# Patient Record
Sex: Male | Born: 1960 | Race: White | Hispanic: No | Marital: Married | Smoking: Never smoker
Health system: Southern US, Community
[De-identification: ages and names within clinical notes are randomized; demographics above are authoritative.]

## PROBLEM LIST (undated history)

## (undated) DIAGNOSIS — F419 Anxiety disorder, unspecified: Secondary | ICD-10-CM

## (undated) DIAGNOSIS — G8929 Other chronic pain: Secondary | ICD-10-CM

## (undated) DIAGNOSIS — R519 Headache, unspecified: Secondary | ICD-10-CM

## (undated) DIAGNOSIS — F32A Depression, unspecified: Secondary | ICD-10-CM

## (undated) DIAGNOSIS — M1990 Unspecified osteoarthritis, unspecified site: Secondary | ICD-10-CM

## (undated) DIAGNOSIS — F329 Major depressive disorder, single episode, unspecified: Secondary | ICD-10-CM

## (undated) DIAGNOSIS — K219 Gastro-esophageal reflux disease without esophagitis: Secondary | ICD-10-CM

## (undated) DIAGNOSIS — R51 Headache: Secondary | ICD-10-CM

## (undated) DIAGNOSIS — J449 Chronic obstructive pulmonary disease, unspecified: Secondary | ICD-10-CM

## (undated) HISTORY — PX: BACK SURGERY: SHX140

## (undated) HISTORY — PX: ROTATOR CUFF REPAIR: SHX139

## (undated) HISTORY — PX: NASAL RECONSTRUCTION: SHX2069

---
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
## 2003-07-06 HISTORY — PX: CARDIAC CATHETERIZATION: SHX172

## 2008-07-05 HISTORY — PX: CERVICAL SPINE SURGERY: SHX589

## 2009-05-14 ENCOUNTER — Ambulatory Visit (HOSPITAL_COMMUNITY): Admission: RE | Admit: 2009-05-14 | Discharge: 2009-05-15 | Payer: Self-pay | Admitting: Neurological Surgery

## 2009-06-16 ENCOUNTER — Encounter: Admission: RE | Admit: 2009-06-16 | Discharge: 2009-06-16 | Payer: Self-pay | Admitting: Neurological Surgery

## 2009-12-16 ENCOUNTER — Encounter: Admission: RE | Admit: 2009-12-16 | Discharge: 2009-12-16 | Payer: Self-pay | Admitting: Neurological Surgery

## 2010-05-25 ENCOUNTER — Encounter: Admission: RE | Admit: 2010-05-25 | Discharge: 2010-05-25 | Payer: Self-pay | Admitting: Neurological Surgery

## 2010-09-21 IMAGING — RF DG CERVICAL SPINE 1V
1 series · 1 of 1 positions shown · non-contrast
Comparison: None

CLINICAL DATA: C5-C7 ACDF

CERVICAL SPINE - 1 VIEW

[Series 1: run · 1 of 1 slices shown]
[im 1/1]
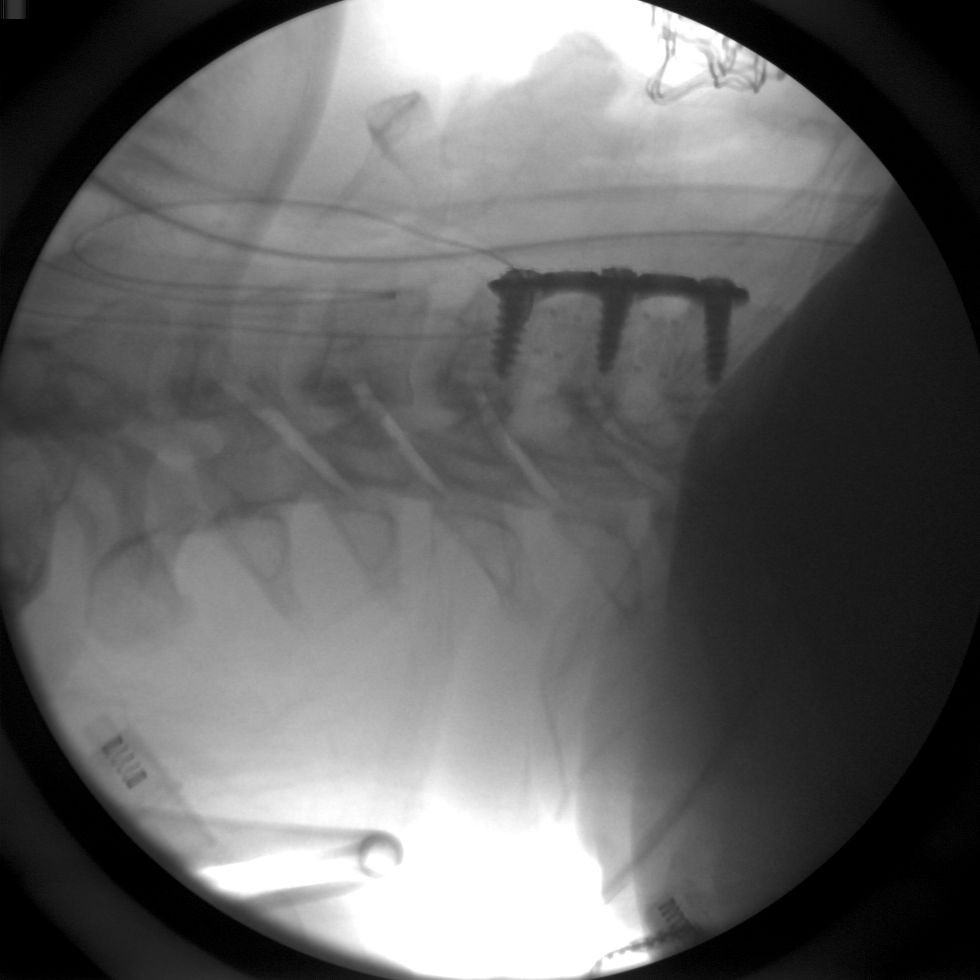

[1 of 1 positions shown; findings below may reference images not displayed]

FINDINGS: The single view shows anterior cervical discectomy and
fusion at C5-6 and C6-7.  Interbody fusion material is present.
There is anterior plate with screw fixation.  No apparent
complication.
IMPRESSION: ACDF C5-C7

## 2010-10-07 LAB — BASIC METABOLIC PANEL
Chloride: 104 mEq/L (ref 96–112)
GFR calc Af Amer: >60 mL/min (ref >60)
Potassium: 4.8 mEq/L (ref 3.5–5.1)

## 2010-10-07 LAB — CBC
HCT: 38 % — ABNORMAL LOW (ref 39.0–52.0)
Hemoglobin: 12.9 g/dL — ABNORMAL LOW (ref 13.0–17.0)
MCV: 95 fL (ref 78.0–100.0)
RBC: 4 MIL/uL — ABNORMAL LOW (ref 4.22–5.81)
WBC: 5.9 10*3/uL (ref 4.0–10.5)

## 2010-10-07 LAB — DIFFERENTIAL
Eosinophils Absolute: 0.1 10*3/uL (ref 0.0–0.7)
Eosinophils Relative: 2 % (ref 0–5)
Lymphs Abs: 1.3 10*3/uL (ref 0.7–4.0)
Monocytes Relative: 6 % (ref 3–12)

## 2010-10-07 LAB — PROTIME-INR: INR: 0.99 (ref 0.00–1.49)

## 2011-10-02 IMAGING — CT CT CERVICAL SPINE W/O CM
3 of 10 series · 11 of 34 positions shown, 12 images · non-contrast
Comparison: Lateral radiograph dated 12/16/2009

CLINICAL DATA: Chronic neck pain.  Prior anterior cervical fusion
at C5-6 and C6-7.

CT CERVICAL SPINE WITHOUT CONTRAST
TECHNIQUE: Multidetector CT imaging of the cervical spine was
performed. Multiplanar CT image reconstructions were also
generated.

[Series 6: t spine bone · axial · 0.32mm/px · z∈[-329,-2]mm · 3 of 132 slices shown, 4 images]
[im 1/132  soft-tissue]
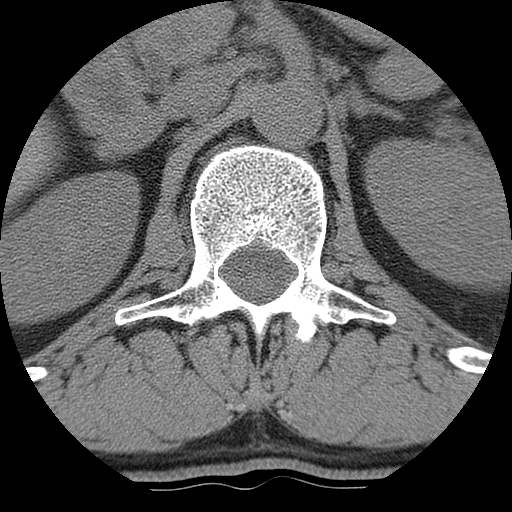
[im 1/132  bone]
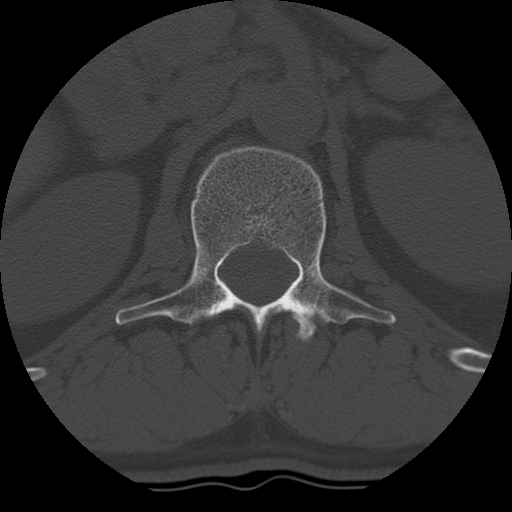
[im 66/132  bone]
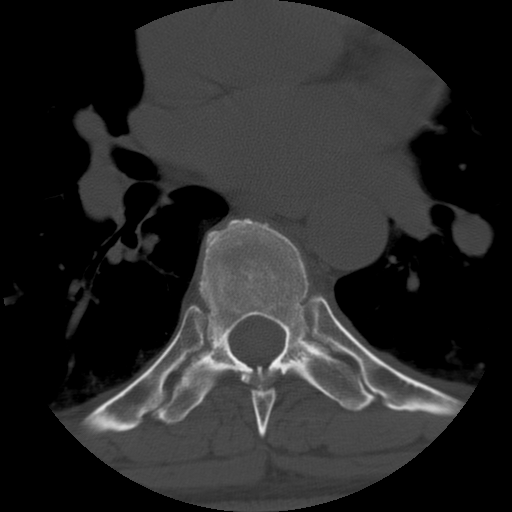
[im 132/132  bone]
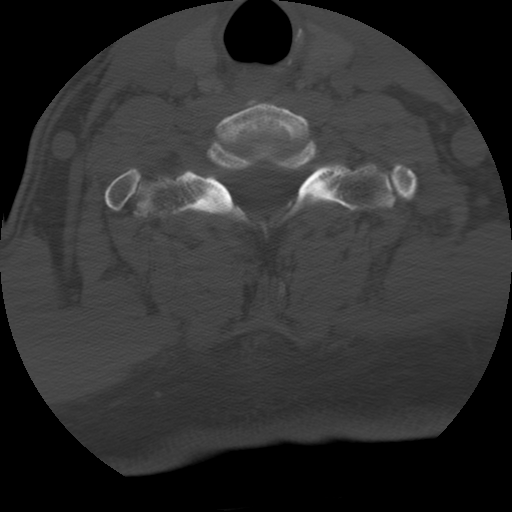

[Series 202: ang axial c-sp · axial · 0.20mm/px · z∈[-4,+90]mm · 3 of 272 slices shown]
[im 68/272  bone]
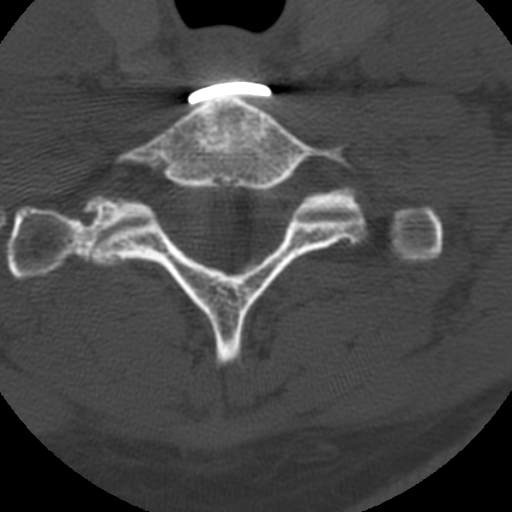
[im 136/272  bone]
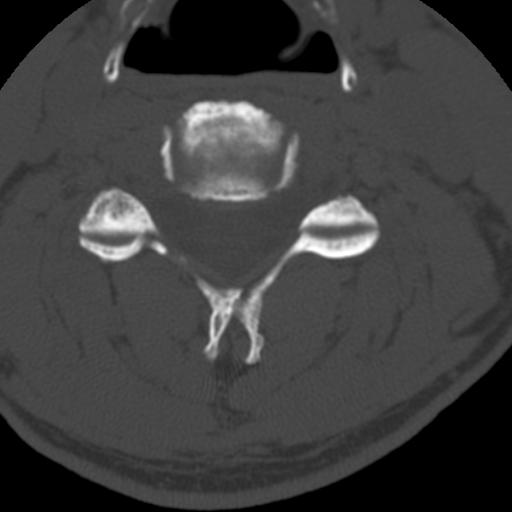
[im 204/272  bone]
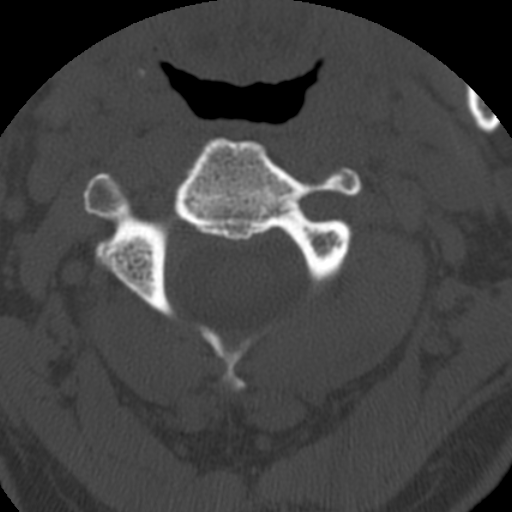

[Series 500: cor - upper t-sp · coronal · 0.66mm/px · 5 of 44 slices shown]
[im 2/44  bone]
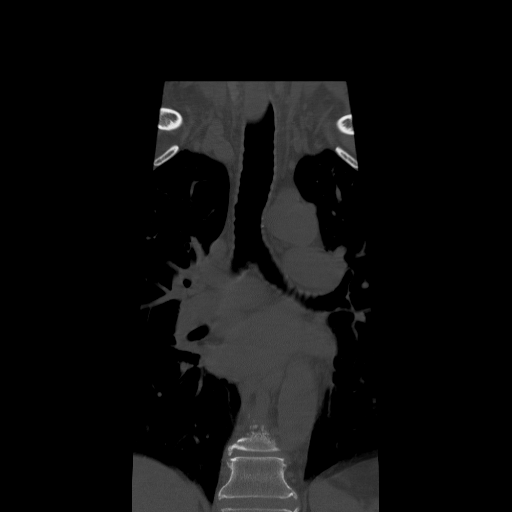
[im 9/44  bone]
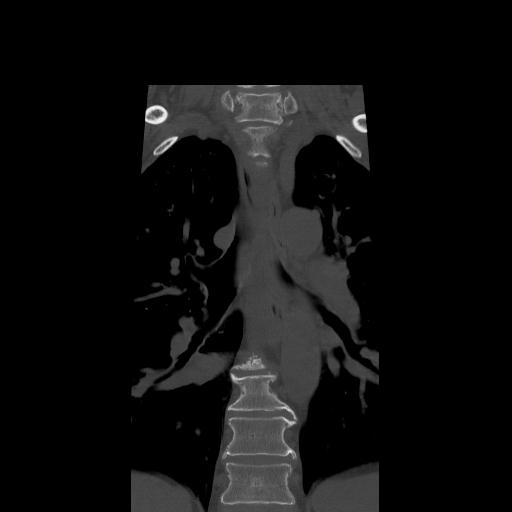
[im 16/44  bone]
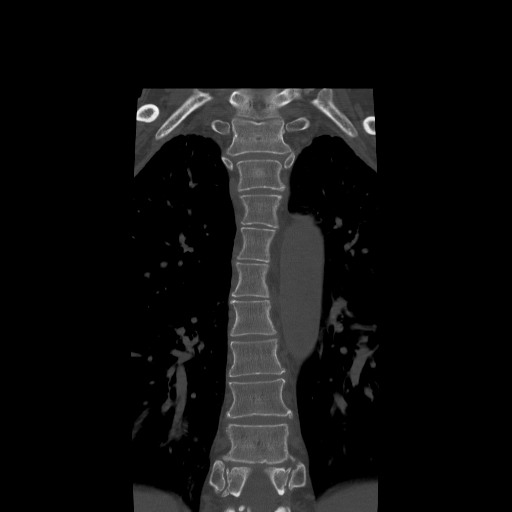
[im 23/44  bone]
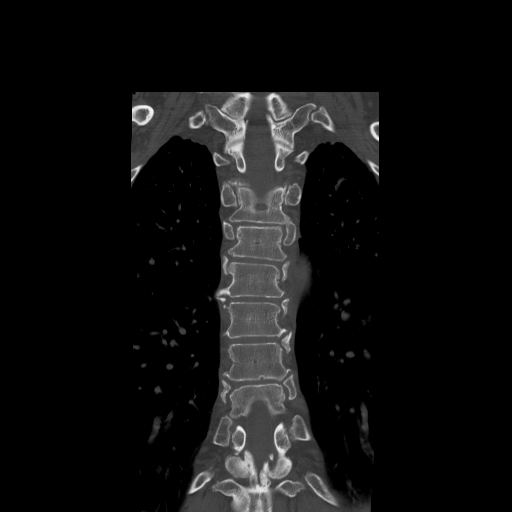
[im 30/44  bone]
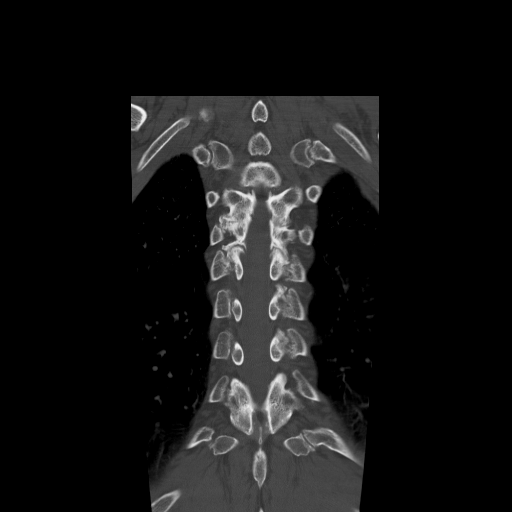

[11 of 34 positions shown; findings below may reference images not displayed]

FINDINGS: The scan extends from the mid clivus through the T1-2
level. The individual disc spaces were examined as follows:

C1-2:  There is degenerative spurring on the tip of the odontoid
process and there are mild arthritic changes between the anterior
arch of C1 and the odontoid.

C2-3: Normal.

C3-4: Normal.

C4-5: Normal.

C5-6: Solid anterior fusion with no residual impingement.

C6-7: Solid anterior fusion with no residual impingement.

C7-T1: Normal.

T1-2:  Normal.

There is no significant abnormality of the paraspinal soft tissues.
IMPRESSION: 1.  Solid anterior fusions at C5-6 and C6-7 with no residual
impingement.
2.  Mild degenerative changes between the anterior arch of C1 and
the odontoid process of C2.
3.  No other significant abnormalities.

## 2016-11-16 ENCOUNTER — Other Ambulatory Visit: Payer: Self-pay | Admitting: Neurological Surgery

## 2016-11-22 ENCOUNTER — Inpatient Hospital Stay (HOSPITAL_COMMUNITY)
Admission: RE | Admit: 2016-11-22 | Discharge: 2016-11-22 | Disposition: A | Discharge status: Home / Self Care | Payer: Self-pay | Source: Ambulatory Visit

## 2016-12-02 NOTE — Progress Notes (Signed)
Several unsuccessful attempts were made to contact pt. Pre-op instructions were left on pt home phone as well as given to pt via pt cell phone. Pt made aware to stop taking  Aspirin, vitamins, fish oil and herbal medications. Do not take any NSAIDs ie: Ibuprofen, Advil, Naproxen, BC and Goody Powder or any medication containing Aspirin. Pt verbalized understanding of all pre-op instructions. Please complete pt assessments on DOS.

## 2016-12-03 ENCOUNTER — Ambulatory Visit (HOSPITAL_COMMUNITY): Payer: Medicare Other | Admitting: Anesthesiology

## 2016-12-03 ENCOUNTER — Encounter (HOSPITAL_COMMUNITY): Payer: Self-pay | Admitting: General Practice

## 2016-12-03 ENCOUNTER — Inpatient Hospital Stay (HOSPITAL_COMMUNITY): Payer: Medicare Other

## 2016-12-03 ENCOUNTER — Encounter (HOSPITAL_COMMUNITY)
Admission: RE | Disposition: A | Discharge status: Home / Self Care | Payer: Self-pay | Source: Ambulatory Visit | Attending: Neurological Surgery

## 2016-12-03 ENCOUNTER — Inpatient Hospital Stay (HOSPITAL_COMMUNITY)
Admission: RE | Admit: 2016-12-03 | Discharge: 2016-12-03 | DRG: 517 | Disposition: A | Discharge status: Home / Self Care | Payer: Medicare Other | Source: Ambulatory Visit | Attending: Neurological Surgery | Admitting: Neurological Surgery

## 2016-12-03 DIAGNOSIS — Z9889 Other specified postprocedural states: Secondary | ICD-10-CM

## 2016-12-03 DIAGNOSIS — Z79891 Long term (current) use of opiate analgesic: Secondary | ICD-10-CM

## 2016-12-03 DIAGNOSIS — M1990 Unspecified osteoarthritis, unspecified site: Secondary | ICD-10-CM | POA: Diagnosis not present

## 2016-12-03 DIAGNOSIS — F329 Major depressive disorder, single episode, unspecified: Secondary | ICD-10-CM | POA: Diagnosis present

## 2016-12-03 DIAGNOSIS — M4802 Spinal stenosis, cervical region: Secondary | ICD-10-CM | POA: Diagnosis not present

## 2016-12-03 DIAGNOSIS — Z419 Encounter for procedure for purposes other than remedying health state, unspecified: Secondary | ICD-10-CM

## 2016-12-03 DIAGNOSIS — J449 Chronic obstructive pulmonary disease, unspecified: Secondary | ICD-10-CM | POA: Diagnosis present

## 2016-12-03 DIAGNOSIS — G8929 Other chronic pain: Secondary | ICD-10-CM | POA: Diagnosis present

## 2016-12-03 DIAGNOSIS — Z79899 Other long term (current) drug therapy: Secondary | ICD-10-CM | POA: Diagnosis not present

## 2016-12-03 DIAGNOSIS — Z7982 Long term (current) use of aspirin: Secondary | ICD-10-CM | POA: Diagnosis not present

## 2016-12-03 DIAGNOSIS — K219 Gastro-esophageal reflux disease without esophagitis: Secondary | ICD-10-CM | POA: Diagnosis not present

## 2016-12-03 DIAGNOSIS — F419 Anxiety disorder, unspecified: Secondary | ICD-10-CM | POA: Diagnosis not present

## 2016-12-03 DIAGNOSIS — M4803 Spinal stenosis, cervicothoracic region: Principal | ICD-10-CM | POA: Diagnosis present

## 2016-12-03 HISTORY — DX: Anxiety disorder, unspecified: F41.9

## 2016-12-03 HISTORY — DX: Major depressive disorder, single episode, unspecified: F32.9

## 2016-12-03 HISTORY — DX: Other chronic pain: G89.29

## 2016-12-03 HISTORY — DX: Depression, unspecified: F32.A

## 2016-12-03 HISTORY — DX: Gastro-esophageal reflux disease without esophagitis: K21.9

## 2016-12-03 HISTORY — DX: Chronic obstructive pulmonary disease, unspecified: J44.9

## 2016-12-03 HISTORY — DX: Unspecified osteoarthritis, unspecified site: M19.90

## 2016-12-03 HISTORY — DX: Headache: R51

## 2016-12-03 HISTORY — DX: Headache, unspecified: R51.9

## 2016-12-03 HISTORY — PX: POSTERIOR CERVICAL LAMINECTOMY: SHX2248

## 2016-12-03 SURGERY — POSTERIOR CERVICAL LAMINECTOMY
Anesthesia: General | Laterality: Right

## 2016-12-03 MED ORDER — THROMBIN 5000 UNITS EX SOLR
CUTANEOUS | Status: AC
  Filled 2016-12-03: qty 15000

## 2016-12-03 MED ORDER — CYCLOBENZAPRINE HCL 10 MG PO TABS
10.0000 mg | ORAL_TABLET | Freq: Three times a day (TID) | ORAL | Status: DC | PRN
  Administered 2016-12-03: 10 mg via ORAL
  Filled 2016-12-03: qty 1

## 2016-12-03 MED ORDER — PHENOL 1.4 % MT LIQD: 1.0000 | OROMUCOSAL | Status: DC | PRN

## 2016-12-03 MED ORDER — AMITRIPTYLINE HCL 50 MG PO TABS
50.0000 mg | ORAL_TABLET | Freq: Every day | ORAL | Status: DC
  Filled 2016-12-03: qty 1

## 2016-12-03 MED ORDER — MIDAZOLAM HCL 5 MG/5ML IJ SOLN
INTRAMUSCULAR | Status: DC | PRN
  Administered 2016-12-03: 2 mg via INTRAVENOUS

## 2016-12-03 MED ORDER — PROPOFOL 10 MG/ML IV BOLUS
INTRAVENOUS | Status: AC
  Filled 2016-12-03: qty 20

## 2016-12-03 MED ORDER — ACETAMINOPHEN 325 MG PO TABS: 650.0000 mg | ORAL_TABLET | ORAL | Status: DC | PRN

## 2016-12-03 MED ORDER — ROCURONIUM BROMIDE 10 MG/ML (PF) SYRINGE
PREFILLED_SYRINGE | INTRAVENOUS | Status: AC
  Filled 2016-12-03: qty 5

## 2016-12-03 MED ORDER — MIDAZOLAM HCL 2 MG/2ML IJ SOLN
INTRAMUSCULAR | Status: AC
  Filled 2016-12-03: qty 2

## 2016-12-03 MED ORDER — VECURONIUM BROMIDE 10 MG IV SOLR
INTRAVENOUS | Status: AC
  Filled 2016-12-03: qty 10

## 2016-12-03 MED ORDER — HYDROMORPHONE HCL 1 MG/ML IJ SOLN
INTRAMUSCULAR | Status: DC | PRN
  Administered 2016-12-03: 1 mg via INTRAVENOUS

## 2016-12-03 MED ORDER — ROCURONIUM BROMIDE 100 MG/10ML IV SOLN
INTRAVENOUS | Status: DC | PRN
  Administered 2016-12-03: 40 mg via INTRAVENOUS
  Administered 2016-12-03: 20 mg via INTRAVENOUS
  Administered 2016-12-03: 40 mg via INTRAVENOUS

## 2016-12-03 MED ORDER — BUPIVACAINE HCL (PF) 0.25 % IJ SOLN
INTRAMUSCULAR | Status: DC | PRN
  Administered 2016-12-03: 10 mL
  Administered 2016-12-03: 7 mL

## 2016-12-03 MED ORDER — PHENYLEPHRINE HCL 10 MG/ML IJ SOLN
INTRAVENOUS | Status: DC | PRN
  Administered 2016-12-03: 45 ug/min via INTRAVENOUS

## 2016-12-03 MED ORDER — HYDROMORPHONE HCL 1 MG/ML IJ SOLN: 0.2500 mg | INTRAMUSCULAR | Status: DC | PRN

## 2016-12-03 MED ORDER — ONDANSETRON HCL 4 MG/2ML IJ SOLN
INTRAMUSCULAR | Status: AC
  Filled 2016-12-03: qty 2

## 2016-12-03 MED ORDER — BACITRACIN ZINC 500 UNIT/GM EX OINT
TOPICAL_OINTMENT | CUTANEOUS | Status: AC
  Filled 2016-12-03: qty 28.35

## 2016-12-03 MED ORDER — SODIUM CHLORIDE 0.9% FLUSH: 3.0000 mL | Freq: Two times a day (BID) | INTRAVENOUS | Status: DC

## 2016-12-03 MED ORDER — ONDANSETRON HCL 4 MG/2ML IJ SOLN
INTRAMUSCULAR | Status: DC | PRN
  Administered 2016-12-03: 4 mg via INTRAVENOUS

## 2016-12-03 MED ORDER — MUPIROCIN 2 % EX OINT
TOPICAL_OINTMENT | CUTANEOUS | Status: AC
  Filled 2016-12-03: qty 22

## 2016-12-03 MED ORDER — VECURONIUM BROMIDE 10 MG IV SOLR
INTRAVENOUS | Status: DC | PRN
  Administered 2016-12-03: 5 mg via INTRAVENOUS

## 2016-12-03 MED ORDER — POTASSIUM CHLORIDE IN NACL 20-0.9 MEQ/L-% IV SOLN
INTRAVENOUS | Status: DC
  Filled 2016-12-03: qty 1000

## 2016-12-03 MED ORDER — METHADONE HCL 10 MG PO TABS: 10.0000 mg | ORAL_TABLET | Freq: Three times a day (TID) | ORAL | Status: DC

## 2016-12-03 MED ORDER — HYDROCODONE-ACETAMINOPHEN 5-325 MG PO TABS
1.0000 | ORAL_TABLET | ORAL | Status: DC | PRN
  Administered 2016-12-03: 2 via ORAL
  Filled 2016-12-03: qty 2

## 2016-12-03 MED ORDER — CYCLOBENZAPRINE HCL 10 MG PO TABS: 10.0000 mg | ORAL_TABLET | Freq: Three times a day (TID) | ORAL | 0 refills | Status: AC | PRN

## 2016-12-03 MED ORDER — SUCCINYLCHOLINE CHLORIDE 200 MG/10ML IV SOSY
PREFILLED_SYRINGE | INTRAVENOUS | Status: AC
  Filled 2016-12-03: qty 10

## 2016-12-03 MED ORDER — LIDOCAINE 2% (20 MG/ML) 5 ML SYRINGE
INTRAMUSCULAR | Status: AC
  Filled 2016-12-03: qty 5

## 2016-12-03 MED ORDER — PREDNISONE 5 MG PO TABS
10.0000 mg | ORAL_TABLET | Freq: Every day | ORAL | Status: DC
  Filled 2016-12-03: qty 2

## 2016-12-03 MED ORDER — THROMBIN 5000 UNITS EX SOLR
CUTANEOUS | Status: DC | PRN
  Administered 2016-12-03 (×2): 5000 [IU] via TOPICAL

## 2016-12-03 MED ORDER — ARTIFICIAL TEARS OPHTHALMIC OINT
TOPICAL_OINTMENT | OPHTHALMIC | Status: AC
  Filled 2016-12-03: qty 3.5

## 2016-12-03 MED ORDER — LIDOCAINE HCL (CARDIAC) 20 MG/ML IV SOLN
INTRAVENOUS | Status: DC | PRN
  Administered 2016-12-03: 60 mg via INTRAVENOUS

## 2016-12-03 MED ORDER — FLUTICASONE PROPIONATE 50 MCG/ACT NA SUSP
1.0000 | Freq: Every day | NASAL | Status: DC | PRN
  Filled 2016-12-03: qty 16

## 2016-12-03 MED ORDER — CEFAZOLIN SODIUM-DEXTROSE 1-4 GM/50ML-% IV SOLN: 1.0000 g | Freq: Three times a day (TID) | INTRAVENOUS | Status: DC

## 2016-12-03 MED ORDER — HYDROCODONE-ACETAMINOPHEN 5-325 MG PO TABS: 1.0000 | ORAL_TABLET | Freq: Three times a day (TID) | ORAL | 0 refills | Status: AC | PRN

## 2016-12-03 MED ORDER — SUGAMMADEX SODIUM 200 MG/2ML IV SOLN
INTRAVENOUS | Status: AC
  Filled 2016-12-03: qty 2

## 2016-12-03 MED ORDER — HYDROCODONE-ACETAMINOPHEN 5-325 MG PO TABS: 1.0000 | ORAL_TABLET | Freq: Four times a day (QID) | ORAL | 0 refills | Status: AC | PRN

## 2016-12-03 MED ORDER — FENTANYL CITRATE (PF) 100 MCG/2ML IJ SOLN
INTRAMUSCULAR | Status: DC | PRN
  Administered 2016-12-03: 100 ug via INTRAVENOUS
  Administered 2016-12-03 (×2): 50 ug via INTRAVENOUS
  Administered 2016-12-03: 100 ug via INTRAVENOUS
  Administered 2016-12-03 (×2): 50 ug via INTRAVENOUS
  Administered 2016-12-03: 100 ug via INTRAVENOUS

## 2016-12-03 MED ORDER — FENTANYL CITRATE (PF) 250 MCG/5ML IJ SOLN
INTRAMUSCULAR | Status: AC
  Filled 2016-12-03: qty 5

## 2016-12-03 MED ORDER — MENTHOL 3 MG MT LOZG: 1.0000 | LOZENGE | OROMUCOSAL | Status: DC | PRN

## 2016-12-03 MED ORDER — HYDROMORPHONE HCL 1 MG/ML IJ SOLN
INTRAMUSCULAR | Status: AC
  Filled 2016-12-03: qty 0.5

## 2016-12-03 MED ORDER — SENNA 8.6 MG PO TABS: 1.0000 | ORAL_TABLET | Freq: Two times a day (BID) | ORAL | Status: DC

## 2016-12-03 MED ORDER — ONDANSETRON HCL 4 MG/2ML IJ SOLN: 4.0000 mg | Freq: Four times a day (QID) | INTRAMUSCULAR | Status: DC | PRN

## 2016-12-03 MED ORDER — LACTATED RINGERS IV SOLN
INTRAVENOUS | Status: DC | PRN
  Administered 2016-12-03 (×2): via INTRAVENOUS

## 2016-12-03 MED ORDER — SUGAMMADEX SODIUM 200 MG/2ML IV SOLN
INTRAVENOUS | Status: DC | PRN
  Administered 2016-12-03: 200 mg via INTRAVENOUS

## 2016-12-03 MED ORDER — CHLORHEXIDINE GLUCONATE CLOTH 2 % EX PADS: 6.0000 | MEDICATED_PAD | Freq: Once | CUTANEOUS | Status: DC

## 2016-12-03 MED ORDER — MELOXICAM 7.5 MG PO TABS: 15.0000 mg | ORAL_TABLET | Freq: Every day | ORAL | Status: DC

## 2016-12-03 MED ORDER — 0.9 % SODIUM CHLORIDE (POUR BTL) OPTIME
TOPICAL | Status: DC | PRN
  Administered 2016-12-03: 1000 mL

## 2016-12-03 MED ORDER — HEMOSTATIC AGENTS (NO CHARGE) OPTIME
TOPICAL | Status: DC | PRN
  Administered 2016-12-03: 1 via TOPICAL

## 2016-12-03 MED ORDER — TAMSULOSIN HCL 0.4 MG PO CAPS
0.4000 mg | ORAL_CAPSULE | Freq: Every day | ORAL | Status: DC
  Administered 2016-12-03: 0.4 mg via ORAL
  Filled 2016-12-03: qty 1

## 2016-12-03 MED ORDER — MUPIROCIN 2 % EX OINT
1.0000 "application " | TOPICAL_OINTMENT | Freq: Once | CUTANEOUS | Status: AC
  Administered 2016-12-03: 1 via TOPICAL

## 2016-12-03 MED ORDER — SODIUM CHLORIDE 0.9% FLUSH
3.0000 mL | INTRAVENOUS | Status: DC | PRN
Start: 2016-12-03 — End: 2016-12-04

## 2016-12-03 MED ORDER — PROMETHAZINE HCL 25 MG/ML IJ SOLN: 6.2500 mg | INTRAMUSCULAR | Status: DC | PRN

## 2016-12-03 MED ORDER — ACETAMINOPHEN 650 MG RE SUPP: 650.0000 mg | RECTAL | Status: DC | PRN

## 2016-12-03 MED ORDER — BUPIVACAINE HCL (PF) 0.25 % IJ SOLN
INTRAMUSCULAR | Status: AC
  Filled 2016-12-03: qty 30

## 2016-12-03 MED ORDER — CEFAZOLIN SODIUM-DEXTROSE 2-4 GM/100ML-% IV SOLN
2.0000 g | INTRAVENOUS | Status: AC
  Administered 2016-12-03: 2 g via INTRAVENOUS

## 2016-12-03 MED ORDER — DEXTROSE 5 % IV SOLN
INTRAVENOUS | Status: DC | PRN
  Administered 2016-12-03: 12:00:00 via INTRAVENOUS

## 2016-12-03 MED ORDER — CEFAZOLIN SODIUM-DEXTROSE 2-4 GM/100ML-% IV SOLN
INTRAVENOUS | Status: AC
  Filled 2016-12-03: qty 100

## 2016-12-03 MED ORDER — ONDANSETRON HCL 4 MG PO TABS: 4.0000 mg | ORAL_TABLET | Freq: Four times a day (QID) | ORAL | Status: DC | PRN

## 2016-12-03 MED ORDER — DEXAMETHASONE SODIUM PHOSPHATE 10 MG/ML IJ SOLN
10.0000 mg | INTRAMUSCULAR | Status: AC
  Administered 2016-12-03: 10 mg via INTRAVENOUS
  Filled 2016-12-03: qty 1

## 2016-12-03 MED ORDER — PROPOFOL 10 MG/ML IV BOLUS
INTRAVENOUS | Status: DC | PRN
  Administered 2016-12-03: 200 mg via INTRAVENOUS
  Administered 2016-12-03 (×2): 50 mg via INTRAVENOUS

## 2016-12-03 MED ORDER — LACTATED RINGERS IV SOLN
INTRAVENOUS | Status: DC
  Administered 2016-12-03: 10:00:00 via INTRAVENOUS

## 2016-12-03 MED ORDER — SUCCINYLCHOLINE CHLORIDE 20 MG/ML IJ SOLN
INTRAMUSCULAR | Status: DC | PRN
  Administered 2016-12-03: 100 mg via INTRAVENOUS

## 2016-12-03 MED ORDER — ARTIFICIAL TEARS OPHTHALMIC OINT
TOPICAL_OINTMENT | OPHTHALMIC | Status: DC | PRN
  Administered 2016-12-03 (×2): 1 via OPHTHALMIC

## 2016-12-03 SURGICAL SUPPLY — 50 items
BAG DECANTER FOR FLEXI CONT (MISCELLANEOUS) ×3 IMPLANT
BENZOIN TINCTURE PRP APPL 2/3 (GAUZE/BANDAGES/DRESSINGS) ×3 IMPLANT
BLADE CLIPPER SURG (BLADE) IMPLANT
BUR MATCHSTICK NEURO 3.0 LAGG (BURR) ×3 IMPLANT
CANISTER SUCT 3000ML PPV (MISCELLANEOUS) ×3 IMPLANT
CARTRIDGE OIL MAESTRO DRILL (MISCELLANEOUS) ×1 IMPLANT
CLOSURE WOUND 1/2 X4 (GAUZE/BANDAGES/DRESSINGS) ×1
DERMABOND ADVANCED (GAUZE/BANDAGES/DRESSINGS) ×2
DERMABOND ADVANCED .7 DNX12 (GAUZE/BANDAGES/DRESSINGS) ×1 IMPLANT
DIFFUSER DRILL AIR PNEUMATIC (MISCELLANEOUS) ×3 IMPLANT
DRAPE LAPAROTOMY 100X72 PEDS (DRAPES) ×3 IMPLANT
DRAPE MICROSCOPE LEICA (MISCELLANEOUS) ×3 IMPLANT
DRAPE POUCH INSTRU U-SHP 10X18 (DRAPES) ×3 IMPLANT
DRSG OPSITE POSTOP 3X4 (GAUZE/BANDAGES/DRESSINGS) ×3 IMPLANT
DURAPREP 26ML APPLICATOR (WOUND CARE) ×3 IMPLANT
ELECT REM PT RETURN 9FT ADLT (ELECTROSURGICAL) ×3
ELECTRODE REM PT RTRN 9FT ADLT (ELECTROSURGICAL) ×1 IMPLANT
GAUZE SPONGE 4X4 12PLY STRL (GAUZE/BANDAGES/DRESSINGS) ×3 IMPLANT
GAUZE SPONGE 4X4 16PLY XRAY LF (GAUZE/BANDAGES/DRESSINGS) IMPLANT
GLOVE BIO SURGEON STRL SZ7 (GLOVE) ×3 IMPLANT
GLOVE BIO SURGEON STRL SZ8 (GLOVE) ×3 IMPLANT
GLOVE BIOGEL PI IND STRL 7.0 (GLOVE) IMPLANT
GLOVE BIOGEL PI INDICATOR 7.0 (GLOVE)
GOWN STRL REUS W/ TWL LRG LVL3 (GOWN DISPOSABLE) ×3 IMPLANT
GOWN STRL REUS W/ TWL XL LVL3 (GOWN DISPOSABLE) ×1 IMPLANT
GOWN STRL REUS W/TWL 2XL LVL3 (GOWN DISPOSABLE) IMPLANT
GOWN STRL REUS W/TWL LRG LVL3 (GOWN DISPOSABLE) ×6
GOWN STRL REUS W/TWL XL LVL3 (GOWN DISPOSABLE) ×2
HEMOSTAT POWDER KIT SURGIFOAM (HEMOSTASIS) ×3 IMPLANT
KIT BASIN OR (CUSTOM PROCEDURE TRAY) ×3 IMPLANT
KIT ROOM TURNOVER OR (KITS) ×3 IMPLANT
NEEDLE HYPO 22GX1.5 SAFETY (NEEDLE) ×3 IMPLANT
NEEDLE SPNL 20GX3.5 QUINCKE YW (NEEDLE) ×3 IMPLANT
NS IRRIG 1000ML POUR BTL (IV SOLUTION) ×3 IMPLANT
OIL CARTRIDGE MAESTRO DRILL (MISCELLANEOUS) ×3
PACK LAMINECTOMY NEURO (CUSTOM PROCEDURE TRAY) ×3 IMPLANT
PAD ARMBOARD 7.5X6 YLW CONV (MISCELLANEOUS) ×3 IMPLANT
PIN MAYFIELD SKULL DISP (PIN) ×3 IMPLANT
RUBBERBAND STERILE (MISCELLANEOUS) ×6 IMPLANT
SPONGE LAP 4X18 X RAY DECT (DISPOSABLE) IMPLANT
SPONGE SURGIFOAM ABS GEL SZ50 (HEMOSTASIS) ×3 IMPLANT
STRIP CLOSURE SKIN 1/2X4 (GAUZE/BANDAGES/DRESSINGS) ×2 IMPLANT
SUT VIC AB 0 CT1 18XCR BRD8 (SUTURE) ×1 IMPLANT
SUT VIC AB 0 CT1 8-18 (SUTURE) ×2
SUT VIC AB 2-0 CP2 18 (SUTURE) ×3 IMPLANT
SUT VIC AB 3-0 SH 8-18 (SUTURE) ×3 IMPLANT
SWABSTICK BENZOIN STERILE (MISCELLANEOUS) ×3 IMPLANT
TOWEL GREEN STERILE (TOWEL DISPOSABLE) ×3 IMPLANT
TOWEL GREEN STERILE FF (TOWEL DISPOSABLE) ×3 IMPLANT
WATER STERILE IRR 1000ML POUR (IV SOLUTION) ×3 IMPLANT

## 2016-12-03 NOTE — H&P (Signed)
Subjective:   Patient is a 56 y.o. male admitted for Right arm pain with hand weakness. The patient first presented to me with complaints of neck pain, shooting pains in the arm(s) and loss of strength of the arm(s). Onset of symptoms was several weeks ago. The pain is described as aching and occurs all day. The pain is rated severe, and is located in the neck and radiates to the right arm. The symptoms have been progressive. Symptoms are exacerbated by extending head backwards, and are relieved by none.  Previous work up includes MRI of cervical spine, results: spinal stenosis.  Past Medical History:  Diagnosis Date  . Anxiety   . Arthritis   . Chronic pain   . COPD (chronic obstructive pulmonary disease) (HCC)    recent diagnosis 2018  . Depression   . GERD (gastroesophageal reflux disease)   . Headache     Past Surgical History:  Procedure Laterality Date  . BACK SURGERY    . CARDIAC CATHETERIZATION  2005  . CERVICAL SPINE SURGERY  2010   C5- C6  . FACIAL FRACTURE SURGERY  1978  . NASAL RECONSTRUCTION Left   . ROTATOR CUFF REPAIR Right     Allergies  Allergen Reactions  . No Known Allergies     Social History  Substance Use Topics  . Smoking status: Never Smoker  . Smokeless tobacco: Never Used  . Alcohol use No    History reviewed. No pertinent family history. Prior to Admission medications   Medication Sig Start Date End Date Taking? Authorizing Provider  amitriptyline (ELAVIL) 50 MG tablet Take 50 mg by mouth at bedtime. 11/15/16  Yes [provider]  Aspirin-Caffeine 845-65 MG PACK Take 2 packets by mouth 3 (three) times daily as needed (pain).   Yes [provider]  betamethasone dipropionate 0.05 % lotion Apply 1 application topically at bedtime as needed (skin irritation).  04/09/16 04/09/17 Yes [provider]  Carboxymethylcellul-Glycerin (LUBRICATING EYE DROPS OP) Apply 2 drops to eye daily as needed.   Yes [provider]   Cholecalciferol (VITAMIN D3) 2000 units capsule Take 4,000 Units by mouth daily.   Yes [provider]  cyanocobalamin 500 MCG tablet Take 1,000 mcg by mouth daily.   Yes [provider]  cyclobenzaprine (FLEXERIL) 10 MG tablet Take 10 mg by mouth every 12 (twelve) hours as needed. 11/16/16  Yes [provider]  fluticasone (FLONASE) 50 MCG/ACT nasal spray Place 1 spray into both nostrils daily as needed for allergies or rhinitis.   Yes [provider]  meloxicam (MOBIC) 15 MG tablet Take 15 mg by mouth daily.   Yes [provider]  methadone (DOLOPHINE) 10 MG tablet Take 10 mg by mouth every 8 (eight) hours as needed for pain. 10/28/16  Yes [provider]  Multiple Vitamin (MULTIVITAMIN WITH MINERALS) TABS tablet Take 1 tablet by mouth daily.   Yes [provider]  predniSONE (DELTASONE) 10 MG tablet Take 5-10 mg by mouth at bedtime.  11/05/16  Yes [provider]  tamsulosin (FLOMAX) 0.4 MG CAPS capsule Take 0.4 mg by mouth daily. 09/13/16  Yes [provider]     Review of Systems  Positive ROS: Negative  All other systems have been reviewed and were otherwise negative with the exception of those mentioned in the HPI and as above.  Objective: Vital signs in last 24 hours: Temp:  [98.1 F (36.7 C)] 98.1 F (36.7 C) (06/01 0938) Pulse Rate:  [70] 70 (  06/01 0938) Resp:  [18] 18 (06/01 0938) BP: (136)/(76) 136/76 (06/01 0938) SpO2:  [98 %] 98 % (06/01 0938) Weight:  [78.5 kg (173 lb 1 oz)] 78.5 kg (173 lb 1 oz) (06/01 [id number])  General Appearance: Alert, cooperative, no distress, appears stated age Head: Normocephalic, without obvious abnormality, atraumatic Eyes: PERRL, conjunctiva/corneas clear, EOM's intact      Neck: Supple, symmetrical, trachea midline, Back: Symmetric, no curvature, ROM normal, no CVA tenderness Lungs:  respirations unlabored Heart: Regular rate and rhythm Abdomen: Soft,  non-tender Extremities: Extremities normal, atraumatic, no cyanosis or edema Pulses: 2+ and symmetric all extremities Skin: Skin color, texture, turgor normal, no rashes or lesions  NEUROLOGIC:  Mental status: Alert and oriented x4, no aphasia, good attention span, fund of knowledge and memory  Motor Exam - grossly normal except for some mild weakness of the right hand Sensory Exam - grossly normal Reflexes: 1+ Coordination - grossly normal Gait - grossly normal Balance - grossly normal Cranial Nerves: I: smell Not tested  II: visual acuity  OS: nl    OD: nl  II: visual fields Full to confrontation  II: pupils Equal, round, reactive to light  III,VII: ptosis None  III,IV,VI: extraocular muscles  Full ROM  V: mastication Normal  V: facial light touch sensation  Normal  V,VII: corneal reflex  Present  VII: facial muscle function - upper  Normal  VII: facial muscle function - lower Normal  VIII: hearing Not tested  IX: soft palate elevation  Normal  IX,X: gag reflex Present  XI: trapezius strength  5/5  XI: sternocleidomastoid strength 5/5  XI: neck flexion strength  5/5  XII: tongue strength  Normal    Data Review Lab Results  Component Value Date   WBC 5.9 05/09/2009   HGB 12.9 (L) 05/09/2009   HCT 38.0 (L) 05/09/2009   MCV 95.0 05/09/2009   PLT 291 05/09/2009   Lab Results  Component Value Date   NA 142 05/09/2009   K 4.8 05/09/2009   CL 104 05/09/2009   CO2 30 05/09/2009   BUN 8 05/09/2009   CREATININE 1.00 05/09/2009   GLUCOSE 96 05/09/2009   Lab Results  Component Value Date   INR 0.99 05/09/2009    Assessment:   Cervical neck pain with herniated nucleus pulposus/ spondylosis/ stenosis at C7-T1 right. Patient has failed conservative therapy. Planned surgery : Right C7-T1 foraminotomy  Plan:   I explained the condition and procedure to the patient and answered any questions.  Patient wishes to proceed with procedure as planned. Understands risks/  benefits/ and expected or typical outcomes.  Joany Khatib S 12/03/2016 11:36 AM

## 2016-12-03 NOTE — Op Note (Signed)
12/03/2016  2:08 PM  PATIENT:  Kyle Henson  [age] y.o. 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 Imaging showed right C7-T1 foraminal stenosis. The patient tried conservative measures without relief. Pain was debilitating. Recommended right C7-T1 decompression. Patient understood the risks, benefits, and alternatives and potential outcomes and wished to proceed.  PROCEDURE DETAILS: The patient was brought to the operating room. Generalized endotracheal anesthesia was induced. The patient was affixed a 3 point [place]field headrest and rolled into the prone position on chest rolls. All pressure points were padded. The posterior cervical region was cleaned and prepped with DuraPrep and then draped in the usual sterile fashion. 7 cc of local anesthesia was injected and a dorsal midline incision made in the posterior cervical region and carried down to the cervical fascia. The fascia was opened and the paraspinous musculature was taken down to expose C7-T1 on the right. Intraoperative fluoroscopy confirmed my level . I could not C7-T1 but I placed a hook under C6-7 and then moved down one level. I used a high-speed drill and the 2 mm Kerrison punch to perform a hemilaminectomy and facetectomy and foraminotomies over her C7-T1 on the right. I think he had a synovial cyst. The ligament and the cyst was stuck to the dura and the nerve root.  Great care was used utilizing microscopic dissection to dissect this away from the nerve root into the nerve root was identified and decompressed. I decompressed until I was distal to the pedicle. Hook then passed easily and the nerve root appeared to be well decompressed. I irrigated with saline solution containing bacitracin. I lined the dura with Gelfoam. After hemostasis was achieved I closed the muscle and the fascia with 0 Vicryl, subcutaneous tissue with 2-0 Vicryl, and the subcuticular tissue with 3-0 Vicryl. The skin was closed with benzoin and Steri-Strips. A sterile dressing was applied, the patient was turned to the supine position and taken out of the headrest, awakened from general anesthesia and transferred to the recovery room in stable condition. At the end of the procedure all sponge, needle and instrument counts were correct.    PLAN OF CARE: Admit to inpatient   PATIENT DISPOSITION:  PACU - hemodynamically stable.   Delay start of Pharmacological VTE agent (>24hrs) due to surgical blood loss or risk of bleeding:  yes

## 2016-12-03 NOTE — Anesthesia Preprocedure Evaluation (Addendum)
Anesthesia Evaluation  Patient identified by MRN, date of birth, ID band Patient awake    Reviewed: Allergy & Precautions, NPO status , Patient's Chart, lab work & pertinent test results  Airway Mallampati: II  TM Distance: >3 FB Neck ROM: Limited    Dental no notable dental hx. (+) Teeth Intact, Dental Advisory Given   Pulmonary neg pulmonary ROS, COPD,    breath sounds clear to auscultation       Cardiovascular  Rhythm:Regular Rate:Normal     Neuro/Psych    GI/Hepatic GERD  ,  Endo/Other    Renal/GU      Musculoskeletal  (+) Arthritis ,   Abdominal   Peds  Hematology   Anesthesia Other Findings   Reproductive/Obstetrics                            Anesthesia Physical Anesthesia Plan  ASA: II  Anesthesia Plan: General   Post-op Pain Management:    Induction: Intravenous  Airway Management Planned: Oral ETT  Additional Equipment:   Intra-op Plan:   Post-operative Plan: Extubation in OR  Informed Consent: I have reviewed the patients History and Physical, chart, labs and discussed the procedure including the risks, benefits and alternatives for the proposed anesthesia with the patient or authorized representative who has indicated his/her understanding and acceptance.     Plan Discussed with: CRNA  Anesthesia Plan Comments:         Anesthesia Quick Evaluation

## 2016-12-03 NOTE — Transfer of Care (Signed)
Immediate Anesthesia Transfer of Care Note  Patient: Kyle Henson  Procedure(s) Performed: Procedure(s): Laminectomy and Foraminotomy - Cervical seven-Thoracic one- right (Right)  Patient Location: PACU  Anesthesia Type:General  Level of Consciousness: awake, oriented, sedated, patient cooperative and responds to stimulation  Airway & Oxygen Therapy: Patient Spontanous Breathing and Patient connected to nasal cannula oxygen  Post-op Assessment: Report given to RN, Post -op Vital signs reviewed and stable, Patient moving all extremities and Patient moving all extremities X 4  Post vital signs: Reviewed and stable  Last Vitals:  Vitals:   12/03/16 0938  BP: 136/76  Pulse: 70  Resp: 18  Temp: 36.7 C    Last Pain:  Vitals:   12/03/16 1001  TempSrc:   PainSc: 7       Patients Stated Pain Goal: 3 (12/03/16 1001)  Complications: No apparent anesthesia complications

## 2016-12-03 NOTE — Progress Notes (Signed)
Pt. discharged home accompanied by wife. Prescriptions and discharge instructions given with verbalization of understanding. Incision site on posterior neck with dressing dry and intact, no s/s of infection - no swelling, no redness, no bleeding and/or drainage noted. Opportunity given to ask questions but no question asked. Pt. transported off this unit in a wheelchair by the nurse tech.

## 2016-12-03 NOTE — Anesthesia Procedure Notes (Signed)
Procedure Name: Intubation Date/Time: 12/03/2016 12:17 PM Performed by: Edmonia CaprioAUSTON, Kyle Hillier M Pre-anesthesia Checklist: Patient identified, Emergency Drugs available, Suction available, Patient being monitored and Timeout performed Patient Re-evaluated:Patient Re-evaluated prior to inductionOxygen Delivery Method: Circle system utilized Preoxygenation: Pre-oxygenation with 100% oxygen Intubation Type: IV induction Ventilation: Mask ventilation without difficulty Laryngoscope Size: Glidescope and 3 Grade View: Grade II Tube type: Oral Tube size: 7.5 mm Number of attempts: 1 Airway Equipment and Method: Stylet and Video-laryngoscopy Placement Confirmation: ETT inserted through vocal cords under direct vision,  positive ETCO2 and breath sounds checked- equal and bilateral Secured at: 24 cm Tube secured with: Tape Dental Injury: Teeth and Oropharynx as per pre-operative assessment  Comments: Neck remained neutral for induction and intubation.

## 2016-12-06 ENCOUNTER — Encounter (HOSPITAL_COMMUNITY): Payer: Self-pay | Admitting: Neurological Surgery

## 2016-12-06 NOTE — Anesthesia Postprocedure Evaluation (Signed)
Anesthesia Post Note  Patient: Kyle KailCharles M Henson  Procedure(s) Performed: Procedure(s) (LRB): Laminectomy and Foraminotomy - Cervical seven-Thoracic one- right (Right)     Patient location during evaluation: PACU Anesthesia Type: General Level of consciousness: awake, oriented and sedated Pain management: pain level controlled Vital Signs Assessment: post-procedure vital signs reviewed and stable Respiratory status: spontaneous breathing, nonlabored ventilation, respiratory function stable and patient connected to nasal cannula oxygen Cardiovascular status: blood pressure returned to baseline and stable Postop Assessment: no signs of nausea or vomiting Anesthetic complications: no    Last Vitals:  Vitals:   12/03/16 1515 12/03/16 1554  BP: (!) 158/94 139/87  Pulse: 83 84  Resp: 12 18  Temp: 36.6 C 36.4 C    Last Pain:  Vitals:   12/03/16 1707  TempSrc:   PainSc: 7                  Zafar Debrosse,JAMES TERRILL

## 2016-12-09 NOTE — Discharge Summary (Signed)
Physician Discharge Summary  Patient ID: Kyle Henson MRN: [id number] DOB/AGE: 03/04/1961 56 y.o.  Admit date: 12/03/2016 Discharge date: 12/09/2016  Admission Diagnoses: Right C7-T1 foraminal stenosis    Discharge Diagnoses: Same   Discharged Condition: good  Hospital Course: The patient was admitted on 12/03/2016 and taken to the operating room where the patient underwent right C7-T1 foraminotomy. The patient tolerated the procedure well and was taken to the recovery room and then to the floor in stable condition. The hospital course was routine. There were no complications. The wound remained clean dry and intact. Pt had appropriate neck soreness. No complaints of arm pain or new N/T/W. The patient remained afebrile with stable vital signs, and tolerated a regular diet. The patient continued to increase activities, and pain was well controlled with oral pain medications.   Consults: None  Significant Diagnostic Studies:  Results for orders placed or performed during the hospital encounter of 24/23/53  Basic metabolic panel  Result Value Ref Range   Sodium 142 135 - 145 mEq/L   Potassium 4.8 3.5 - 5.1 mEq/L   Chloride 104 96 - 112 mEq/L   CO2 30 19 - 32 mEq/L   Glucose, Bld 96 70 - 99 mg/dL   BUN 8 6 - 23 mg/dL   Creatinine, Ser 1.00 0.4 - 1.5 mg/dL   Calcium 9.7 8.4 - 10.5 mg/dL   GFR calc non Af Amer >60 >60 mL/min   GFR calc Af Amer  >60 mL/min    >60        The eGFR has been calculated using the MDRD equation. This calculation has not been validated in all clinical situations. eGFR's persistently <60 mL/min signify possible Chronic Kidney Disease.  CBC  Result Value Ref Range   WBC 5.9 4.0 - 10.5 K/uL   RBC 4.00 (L) 4.22 - 5.81 MIL/uL   Hemoglobin 12.9 (L) 13.0 - 17.0 g/dL   HCT 38.0 (L) 39.0 - 52.0 %   MCV 95.0 78.0 - 100.0 fL   MCHC 33.8 30.0 - 36.0 g/dL   RDW 14.2 11.5 - 15.5 %   Platelets 291 150 - 400 K/uL  Differential  Result Value Ref Range   Neutrophils Relative % 70 43 - 77 %   Neutro Abs 4.1 1.7 - 7.7 K/uL   Lymphocytes Relative 22 12 - 46 %   Lymphs Abs 1.3 0.7 - 4.0 K/uL   Monocytes Relative 6 3 - 12 %   Monocytes Absolute 0.3 0.1 - 1.0 K/uL   Eosinophils Relative 2 0 - 5 %   Eosinophils Absolute 0.1 0.0 - 0.7 K/uL   Basophils Relative 0 0 - 1 %   Basophils Absolute 0.0 0.0 - 0.1 K/uL  Protime-INR  Result Value Ref Range   Prothrombin Time 13.0 11.6 - 15.2 seconds   INR 0.99 0.00 - 1.49  APTT  Result Value Ref Range   aPTT 36 24 - 37 seconds    Dg Cervical Spine 1 View  Result Date: 12/03/2016 FLUOROSCOPY TIME:  4 seconds C-arm fluoroscopic images were obtained intraoperatively and submitted for post operative interpretation. Please see the performing provider's procedural report for the fluoroscopy time utilized. EXAM: CERVICAL SPINE 1 VIEW COMPARISON:  MRI dated 07/30/2016 FINDINGS: Single lateral C-arm image of the cervical spine demonstrates instruments at C6-7 and C7-T1. IMPRESSION: Instruments at C6-7 and C7-T1. Electronically Signed   By: Lorriane Shire M.D.   On: 12/03/2016 15:36   Dg C-arm 1-60 Min  Result Date: 12/03/2016  FLUOROSCOPY TIME:  4 seconds C-arm fluoroscopic images were obtained intraoperatively and submitted for post operative interpretation. Please see the performing provider's procedural report for the fluoroscopy time utilized. EXAM: CERVICAL SPINE 1 VIEW COMPARISON:  MRI dated 07/30/2016 FINDINGS: Single lateral C-arm image of the cervical spine demonstrates instruments at C6-7 and C7-T1. IMPRESSION: Instruments at C6-7 and C7-T1. Electronically Signed   By: Lorriane Shire M.D.   On: 12/03/2016 15:36    Antibiotics:  Anti-infectives    Start     Dose/Rate Route Frequency Ordered Stop   12/03/16 2000  ceFAZolin (ANCEF) IVPB 1 g/50 mL premix  Status:  Discontinued     1 g 100 mL/hr over 30 Minutes Intravenous Every 8 hours 12/03/16 1552 12/04/16 0512   12/03/16 0940  ceFAZolin (ANCEF) 2-4  GM/100ML-% IVPB    Comments:  Forte, Lindsi   : cabinet override      12/03/16 0940 12/03/16 1221   12/03/16 0931  ceFAZolin (ANCEF) IVPB 2g/100 mL premix     2 g 200 mL/hr over 30 Minutes Intravenous On call to O.R. 12/03/16 0931 12/03/16 1251      Discharge Exam: Blood pressure 139/87, pulse 84, temperature 97.6 F (36.4 C), resp. rate 18, height 6' (1.829 m), weight 78.5 kg (173 lb 1 oz), SpO2 96 %. Neurologic: Grossly normal Dressing dry  Discharge Medications:   Allergies as of 12/03/2016      Reactions   No Known Allergies       Medication List    TAKE these medications   amitriptyline 50 MG tablet Commonly known as:  ELAVIL Take 50 mg by mouth at bedtime.   Aspirin-Caffeine 845-65 MG Pack Take 2 packets by mouth 3 (three) times daily as needed (pain).   betamethasone dipropionate 0.05 % lotion Apply 1 application topically at bedtime as needed (skin irritation).   cyanocobalamin 500 MCG tablet Take 1,000 mcg by mouth daily.   cyclobenzaprine 10 MG tablet Commonly known as:  FLEXERIL Take 1 tablet (10 mg total) by mouth 3 (three) times daily as needed. What changed:  when to take this   fluticasone 50 MCG/ACT nasal spray Commonly known as:  FLONASE Place 1 spray into both nostrils daily as needed for allergies or rhinitis.   HYDROcodone-acetaminophen 5-325 MG tablet Commonly known as:  NORCO Take 1 tablet by mouth every 6 (six) hours as needed for moderate pain.   HYDROcodone-acetaminophen 5-325 MG tablet Commonly known as:  NORCO Take 1 tablet by mouth every 8 (eight) hours as needed for moderate pain.   LUBRICATING EYE DROPS OP Apply 2 drops to eye daily as needed.   meloxicam 15 MG tablet Commonly known as:  MOBIC Take 15 mg by mouth daily.   methadone 10 MG tablet Commonly known as:  DOLOPHINE Take 10 mg by mouth every 8 (eight) hours as needed for pain.   multivitamin with minerals Tabs tablet Take 1 tablet by mouth daily.   predniSONE 10  MG tablet Commonly known as:  DELTASONE Take 5-10 mg by mouth at bedtime.   tamsulosin 0.4 MG Caps capsule Commonly known as:  FLOMAX Take 0.4 mg by mouth daily.   Vitamin D3 2000 units capsule Take 4,000 Units by mouth daily.       Disposition: Home   Final Dx: Right C7-T1 foraminotomy  Discharge Instructions     Remove dressing in 72 hours    Complete by:  As directed    Call MD for:  difficulty breathing, headache or  visual disturbances    Complete by:  As directed    Call MD for:  persistant nausea and vomiting    Complete by:  As directed    Call MD for:  redness, tenderness, or signs of infection (pain, swelling, redness, odor or green/yellow discharge around incision site)    Complete by:  As directed    Call MD for:  severe uncontrolled pain    Complete by:  As directed    Call MD for:  temperature >100.4    Complete by:  As directed    Diet - low sodium heart healthy    Complete by:  As directed    Increase activity slowly    Complete by:  As directed       Follow-up Information    Eustace Moore, MD. Schedule an appointment as soon as possible for a visit in 2 week(s).   Specialty:  Neurosurgery Contact information: [address] [place] [address] [place] [address] [phone]            Signed: Eustace Moore 12/09/2016, 1:39 PM

## 2018-04-12 IMAGING — RF DG CERVICAL SPINE 1V
1 series · 1 of 1 positions shown · non-contrast
Comparison: MRI dated 07/30/2016

FLUOROSCOPY TIME:  4 seconds

C-arm fluoroscopic images were obtained intraoperatively and
submitted for post operative interpretation. Please see the
performing provider's procedural report for the fluoroscopy time
utilized.
EXAM:
CERVICAL SPINE 1 VIEW

[Series 1: run · 1 of 1 slices shown]
[im 1/1]
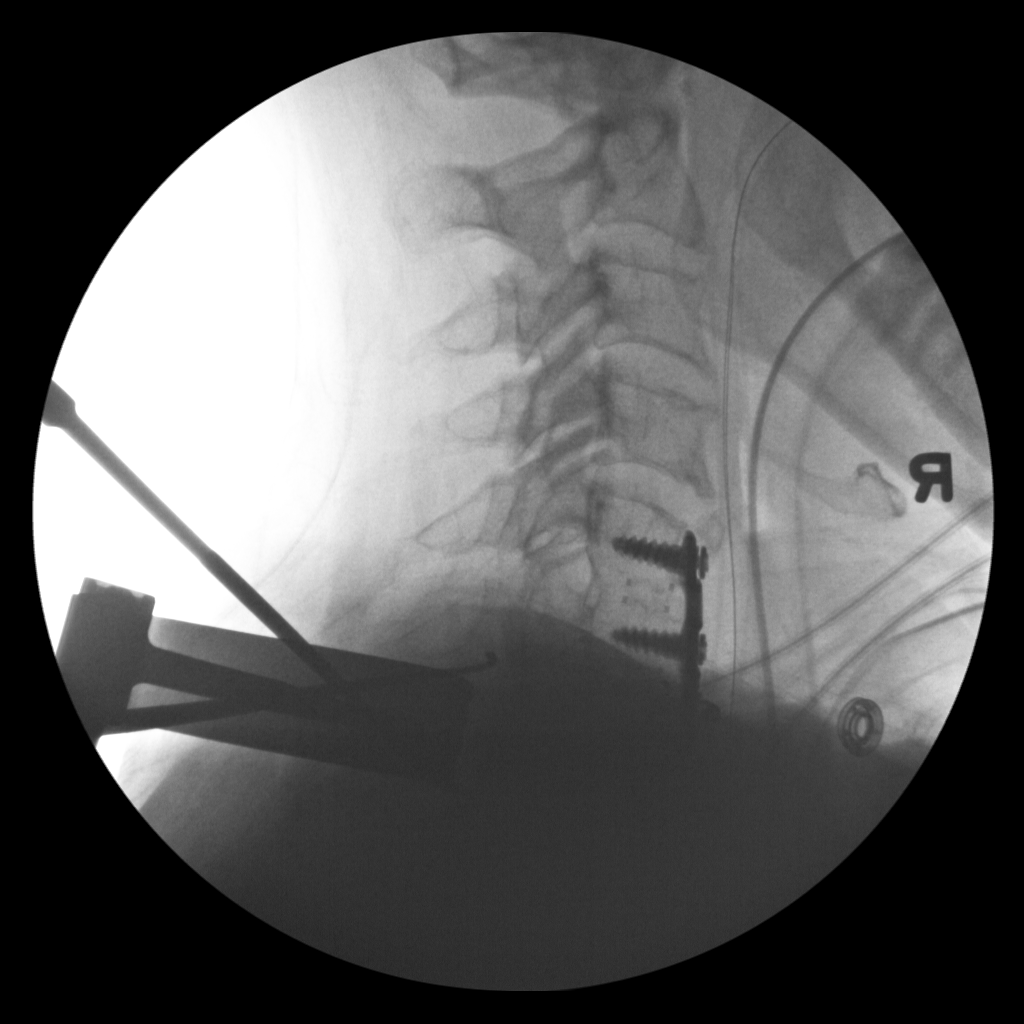

[1 of 1 positions shown; findings below may reference images not displayed]

FINDINGS: Single lateral C-arm image of the cervical spine demonstrates
instruments at C6-7 and C7-T1.
IMPRESSION: Instruments at C6-7 and C7-T1.

## 2023-05-06 DEATH — deceased
